# Patient Record
Sex: Male | Born: 1960 | Race: White | Hispanic: No | Smoking: Never smoker
Health system: Southern US, Community
[De-identification: ages and names within clinical notes are randomized; demographics above are authoritative.]

## PROBLEM LIST (undated history)

## (undated) HISTORY — PX: REPLACEMENT TOTAL HIP W/  RESURFACING IMPLANTS: SUR1222

## (undated) HISTORY — PX: HERNIA REPAIR: SHX51

---

## 2004-01-15 ENCOUNTER — Ambulatory Visit: Payer: Self-pay | Admitting: Unknown Physician Specialty

## 2004-02-16 ENCOUNTER — Ambulatory Visit: Payer: Self-pay | Admitting: Surgery

## 2004-06-28 ENCOUNTER — Ambulatory Visit: Payer: Self-pay | Admitting: Gastroenterology

## 2008-07-14 ENCOUNTER — Ambulatory Visit: Payer: Self-pay | Admitting: Family Medicine

## 2008-10-02 ENCOUNTER — Ambulatory Visit: Payer: Self-pay | Admitting: Family Medicine

## 2010-03-26 ENCOUNTER — Ambulatory Visit: Payer: Self-pay | Admitting: Family Medicine

## 2010-04-28 IMAGING — CR CERVICAL SPINE - COMPLETE 4+ VIEW
1 series · 7 of 7 positions shown · non-contrast
Comparison: none

REASON FOR EXAM: neck pain radiating down arm
COMMENTS:

[Series 1: view not recorded · 0.17mm/px · 7 of 7 slices shown]
[im 1/7]
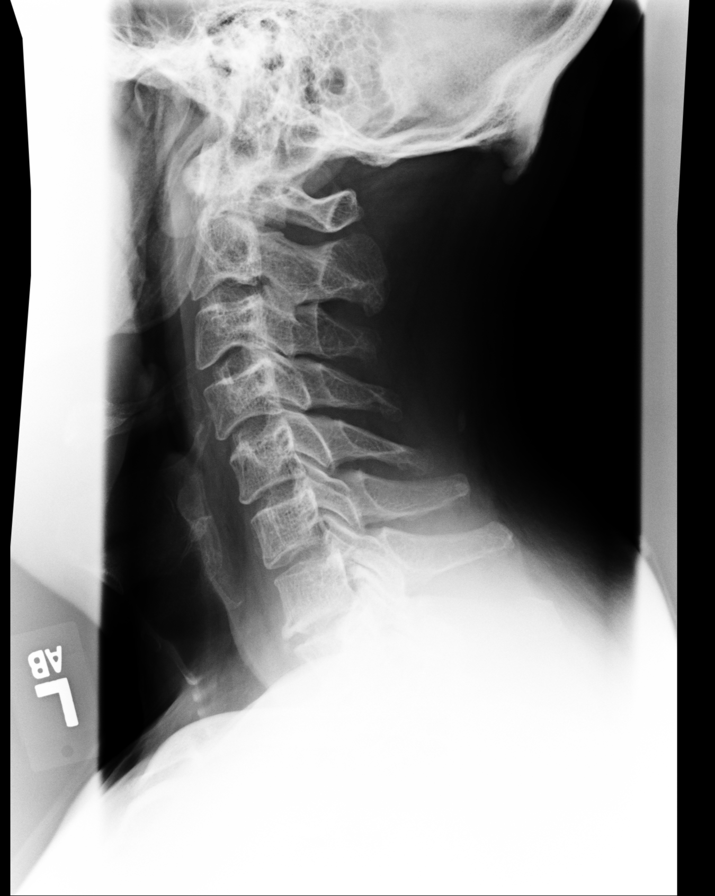
[im 2/7]
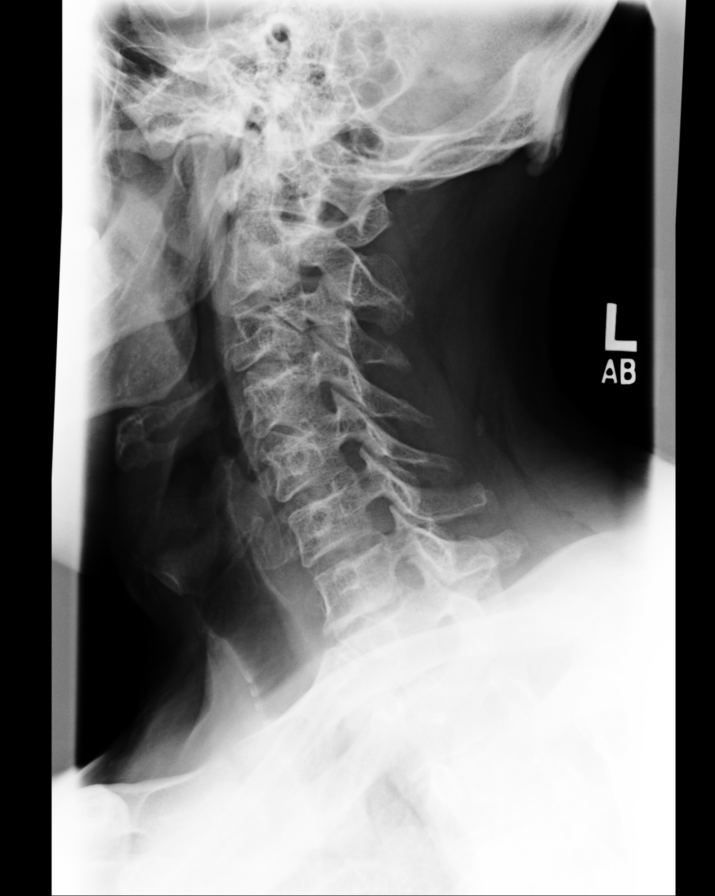
[im 3/7]
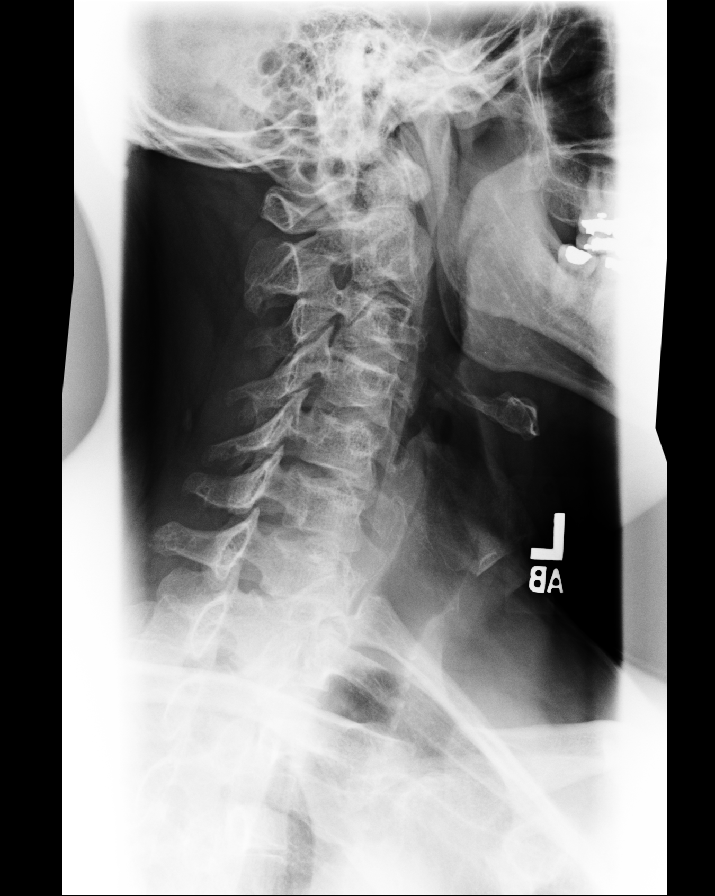
[im 4/7]
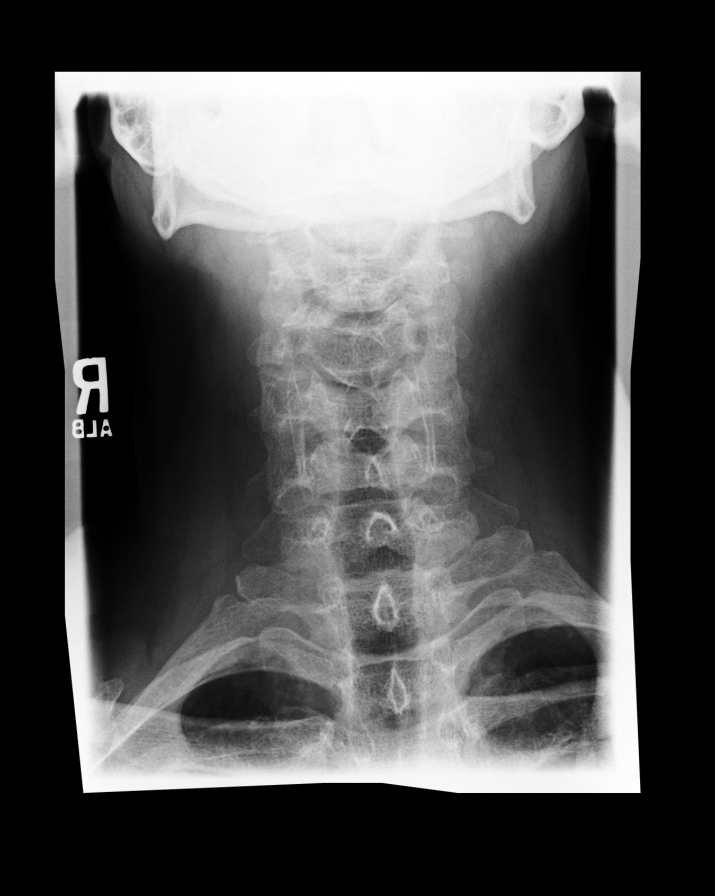
[im 5/7]
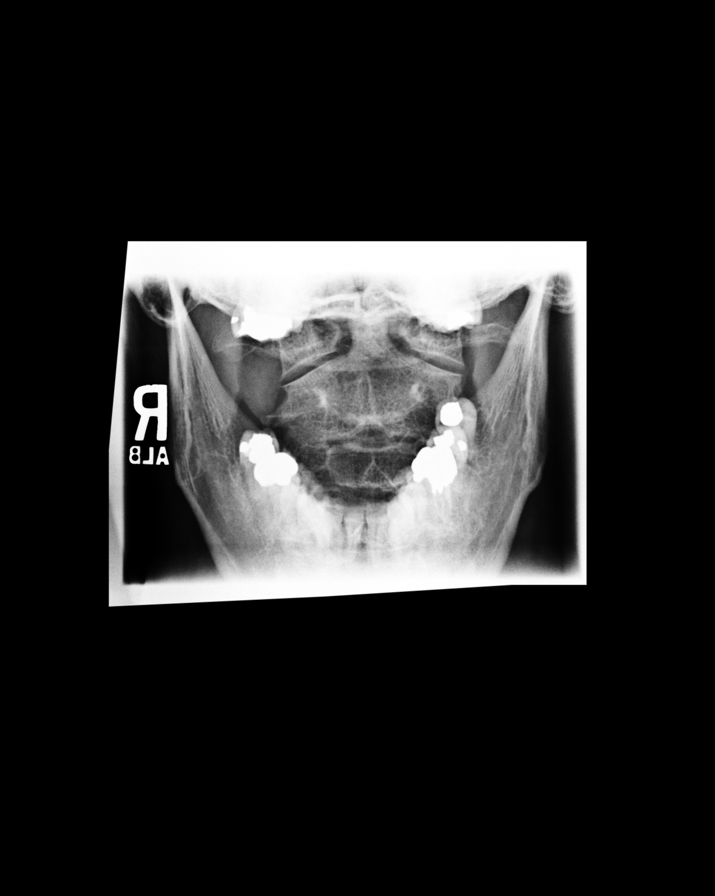
[im 6/7]
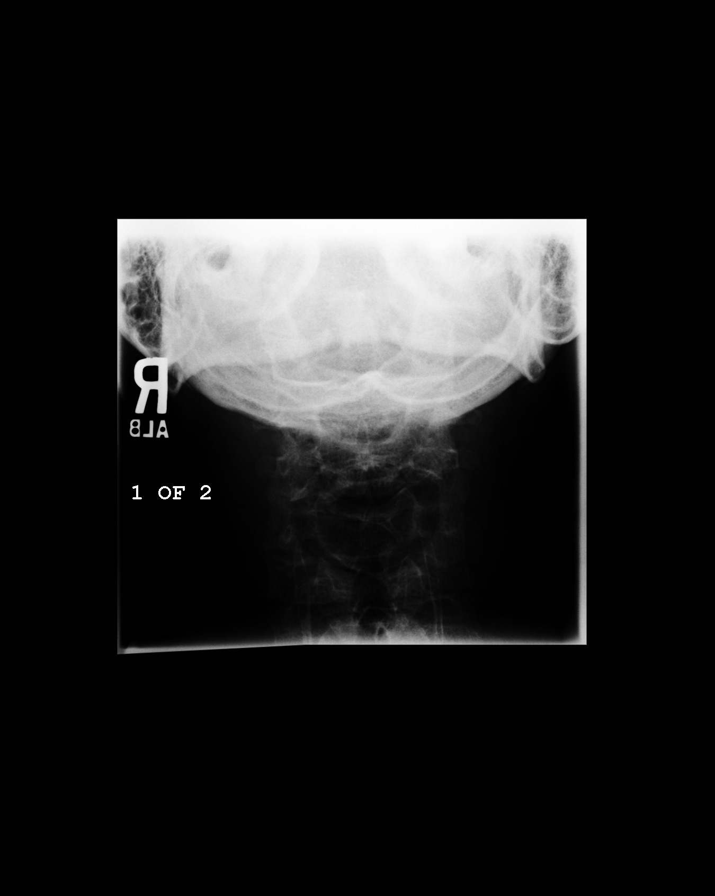
[im 7/7]
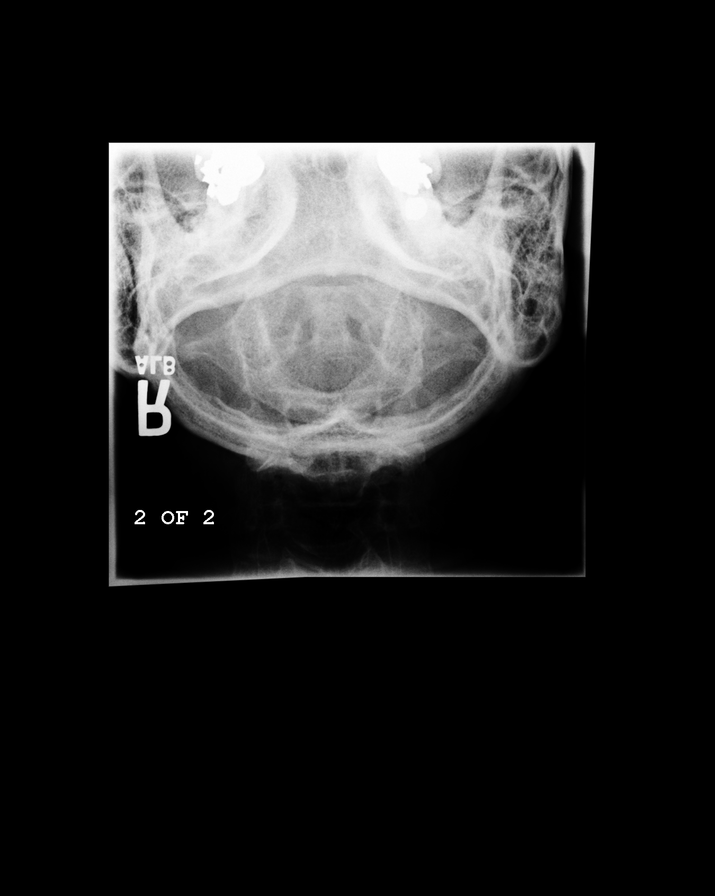

[7 of 7 positions shown; findings below may reference images not displayed]

PROCEDURE:     MDR - MDR CERVICAL SPINE COMPLETE  - October 02, 2008  [DATE]

RESULT:     The cervical vertebral bodies are preserved in height. The
intervertebral disc space heights are well-maintained. The oblique views
reveal mild bony encroachment upon the neural foramina bilaterally in the
upper cervical spine. The odontoid is intact. The lateral masses of C1 align
normally with those of C2.
IMPRESSION: There is mild neural foraminal encroachment in the upper
cervical spine due to chiefly to facet joint osteophyte. Given the patient's
radicular symptoms, MRI may be a useful next step.

## 2011-10-20 IMAGING — CT CT ABDOMEN W/ CM
1 of 2 series · 15 of 32 positions shown, 20 images · IV contrast (isovue)
Comparison: none

REASON FOR EXAM: abdominal pain elevated bilirubin puritis
COMMENTS:

PROCEDURE:     SHKUMBIN - SHKUMBIN ABDOMEN STANDARD W  - March 26, 2010  [DATE]
RESULT:     Comparison: None.
TECHNIQUE: Multiple axial images were obtained of the abdomen, from the lung
bases to the iliac crests, after the administration of oral contrast and 100
mL Isovue 370 intravenous contrast.

[Series 2: soft tissue · axial · 0.81mm/px · z∈[-45,+230]mm · 15 of 63 slices shown, 20 images]
[im 4/63  soft-tissue]
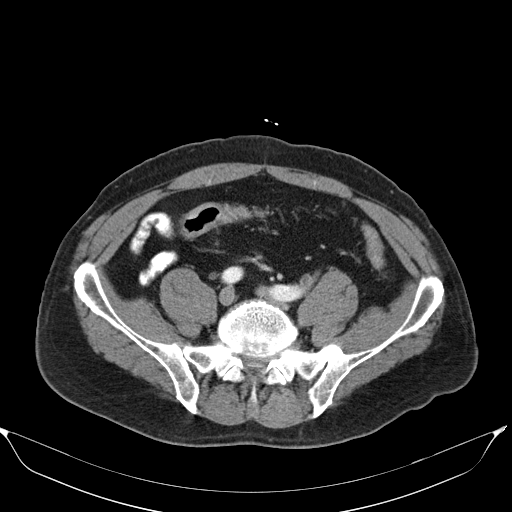
[im 4/63  bone]
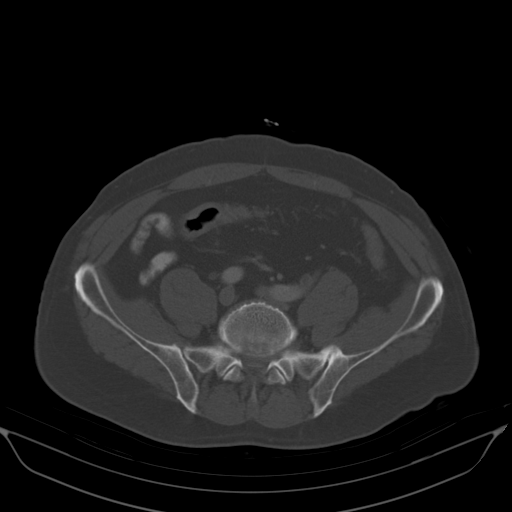
[im 10/63  soft-tissue]
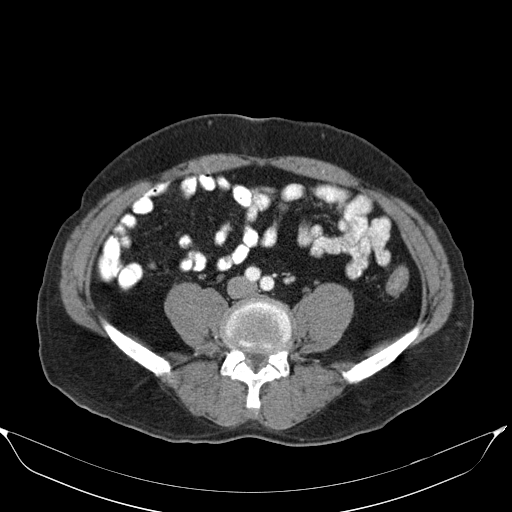
[im 13/63  soft-tissue]
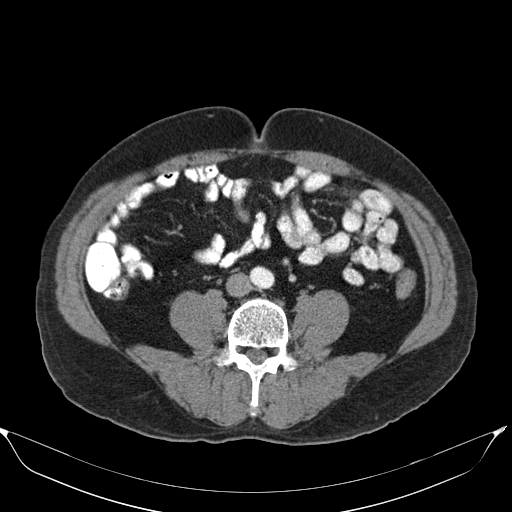
[im 16/63  soft-tissue]
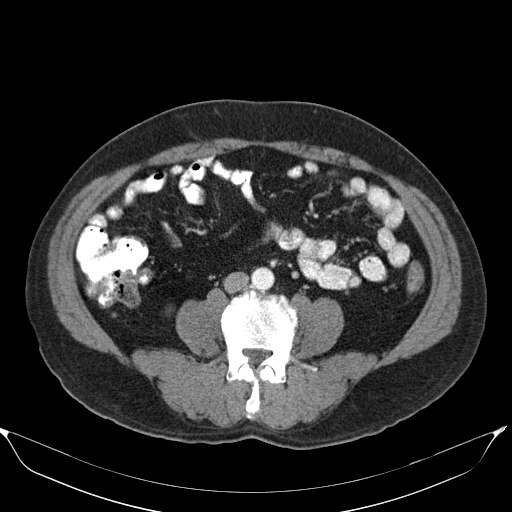
[im 22/63  soft-tissue]
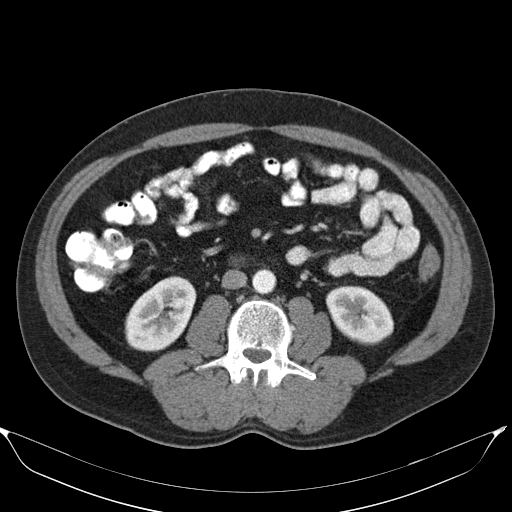
[im 25/63  soft-tissue]
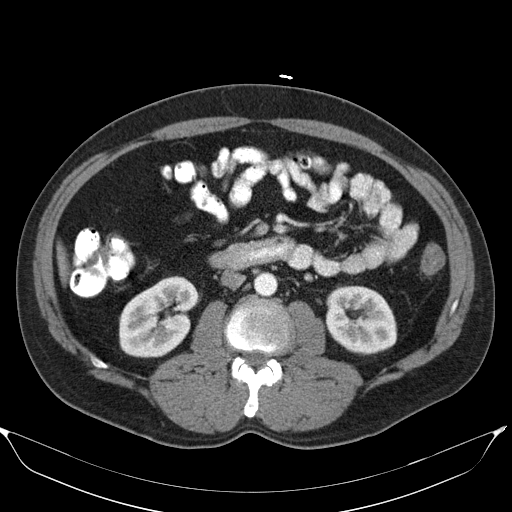
[im 28/63  soft-tissue]
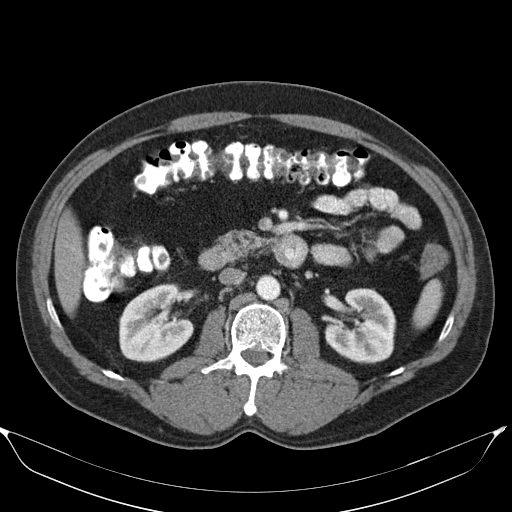
[im 35/63  soft-tissue]
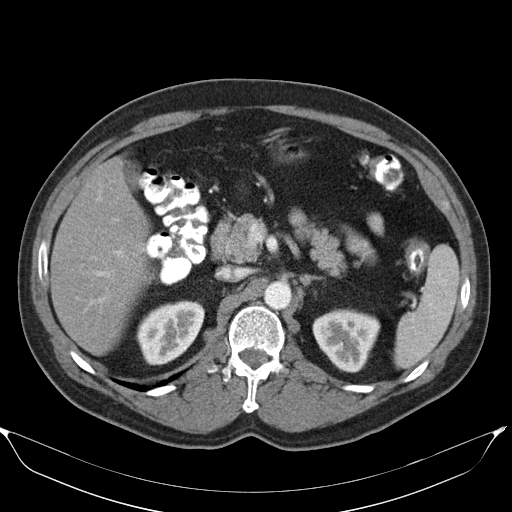
[im 38/63  soft-tissue]
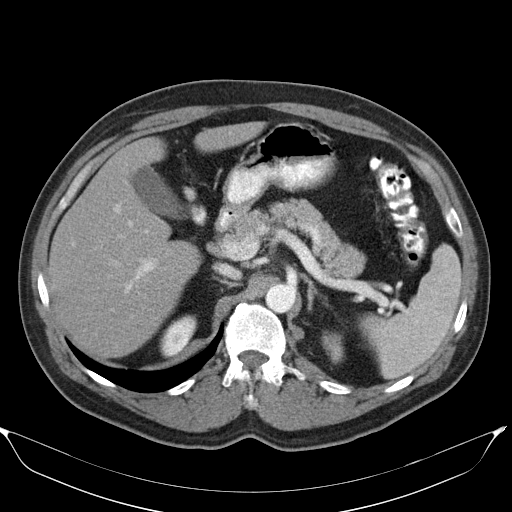
[im 38/63  bone]
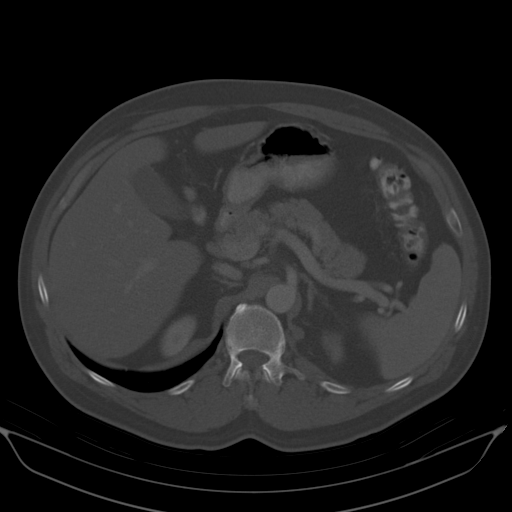
[im 41/63  soft-tissue]
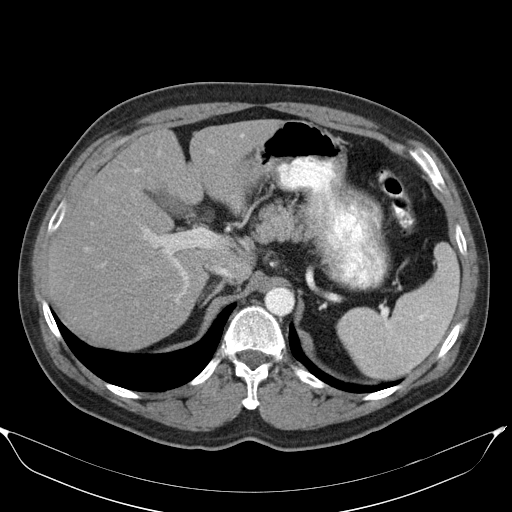
[im 47/63  soft-tissue]
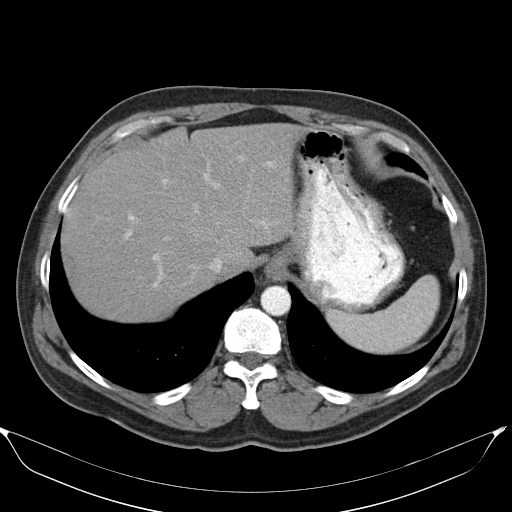
[im 50/63  soft-tissue]
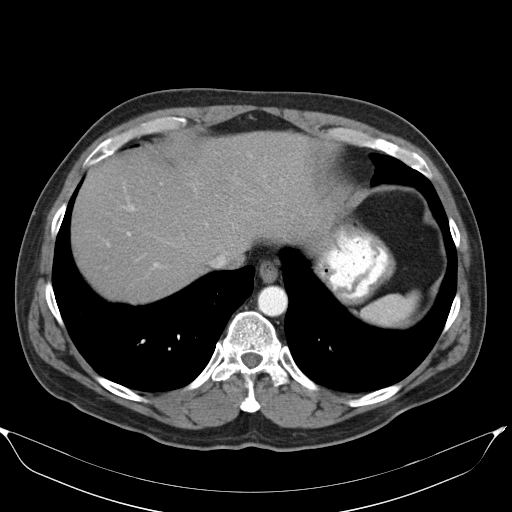
[im 50/63  lung]
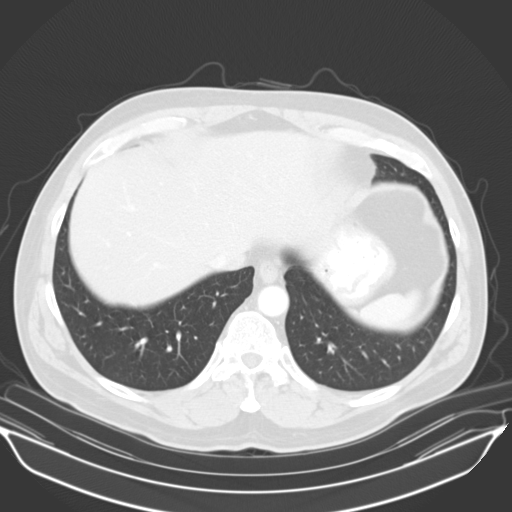
[im 53/63  soft-tissue]
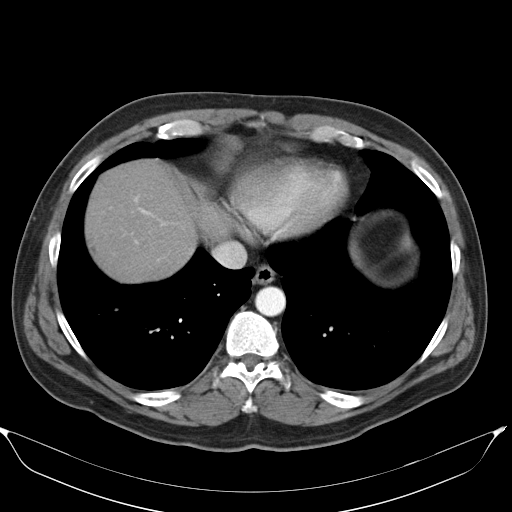
[im 53/63  lung]
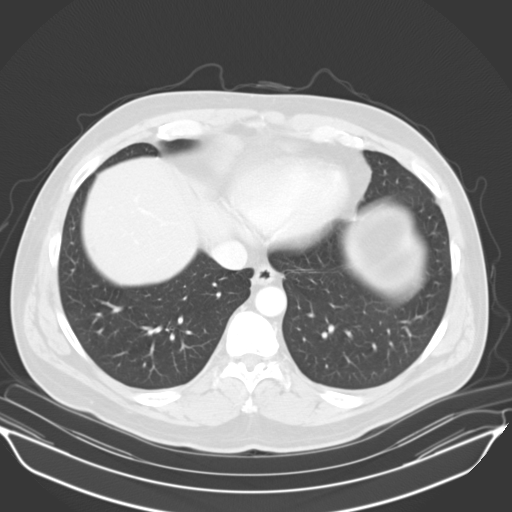
[im 56/63  lung]
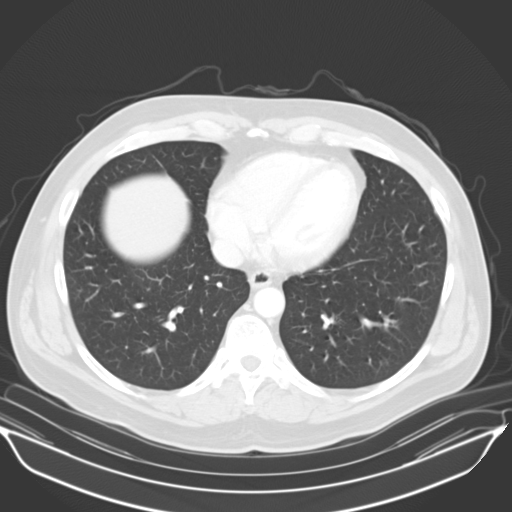
[im 59/63  soft-tissue]
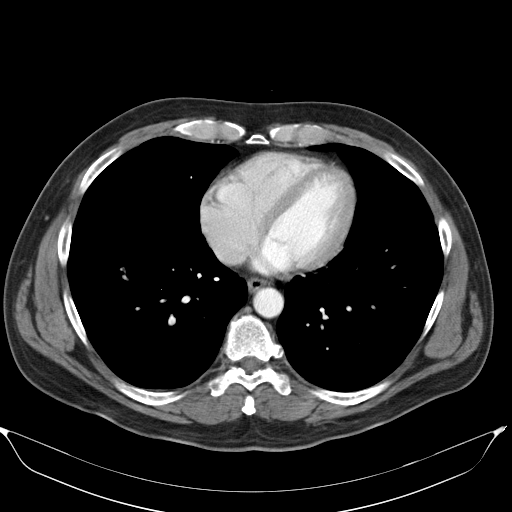
[im 59/63  lung]
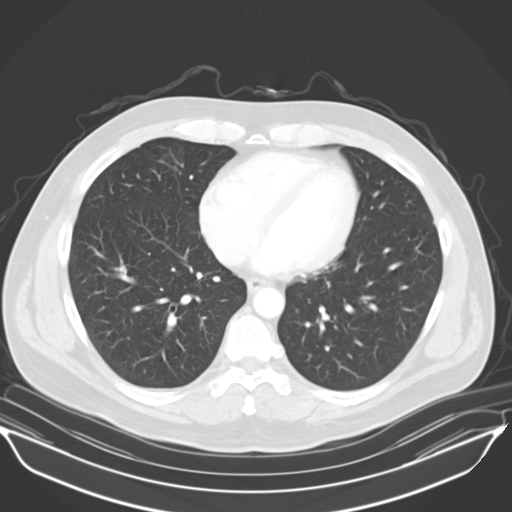

[15 of 32 positions shown; findings below may reference images not displayed]

FINDINGS: No focal liver lesion identified. No intrahepatic or extrahepatic biliary
ductal dilatation. The gallbladder is unremarkable.

The spleen, adrenals, and pancreas are unremarkable. The kidneys enhance
normally. The visualized small and large bowel are normal in caliber. The
appendix is normal.

No aggressive lytic or sclerotic osseous lesions identified.
IMPRESSION: Unremarkable abdominal CT.

## 2015-11-06 ENCOUNTER — Other Ambulatory Visit: Payer: Self-pay

## 2016-11-11 ENCOUNTER — Other Ambulatory Visit: Payer: Self-pay

## 2019-09-23 ENCOUNTER — Other Ambulatory Visit: Payer: Self-pay

## 2019-09-23 ENCOUNTER — Encounter: Payer: Self-pay | Admitting: Emergency Medicine

## 2019-09-23 ENCOUNTER — Ambulatory Visit
Admission: EM | Admit: 2019-09-23 | Discharge: 2019-09-23 | Disposition: A | Discharge status: Home / Self Care | Payer: Managed Care, Other (non HMO) | Attending: Family Medicine | Admitting: Family Medicine

## 2019-09-23 DIAGNOSIS — L03114 Cellulitis of left upper limb: Secondary | ICD-10-CM

## 2019-09-23 MED ORDER — SULFAMETHOXAZOLE-TRIMETHOPRIM 800-160 MG PO TABS: 1.0000 | ORAL_TABLET | Freq: Two times a day (BID) | ORAL | 0 refills | Status: DC

## 2019-09-23 NOTE — Discharge Instructions (Signed)
Warm compresses to affected area °

## 2019-09-23 NOTE — ED Triage Notes (Signed)
Patient states that he got stung in his left lower arm 2 days ago.  Patient reports redness, tenderness, and swelling around the site that started yesterday. Patient states that he has been taking Benadryl.

## 2019-09-23 NOTE — ED Provider Notes (Addendum)
[facility]    CSN: [id number] Arrival date & time: 09/23/19  1020      History   Chief Complaint Chief Complaint  Patient presents with  . Insect Bite    HPI Wayne Owens is a 59 y.o. male.   59 yo male with a c/o a bee or wasp sting to his left forearm 2 days ago. States that initially he had an allergic reaction that same day with redness all over his body, took some benadryl and resolved. However patient since yesterday has noticed localized redness and swelling on the left forearm skin around the stung site. Denies any fevers, chills, shortness of breath.  States he's still taking benadryl.      History reviewed. No pertinent past medical history.  There are no problems to display for this patient.   Past Surgical History:  Procedure Laterality Date  . HERNIA REPAIR    . 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 Authorizing Provider  Multiple Vitamin (MULTIVITAMIN) capsule Take 1 capsule by mouth daily.    [provider]  sulfamethoxazole-trimethoprim (BACTRIM DS) 800-160 MG tablet Take 1 tablet by mouth 2 (two) times daily. 09/23/19   Payton Mccallum, MD    Family History History reviewed. No pertinent family history.  Social History Social History   Tobacco Use  . Smoking status: Never Smoker  . Smokeless tobacco: Never Used  Vaping Use  . Vaping Use: Never used  Substance Use Topics  . Alcohol use: Yes  . Drug use: Never     Allergies   Patient has no known allergies.   Review of Systems Review of Systems   Physical Exam Triage Vital Signs ED Triage Vitals  Enc Vitals Group     BP 09/23/19 1036 120/81     Pulse Rate 09/23/19 1036 69     Resp 09/23/19 1036 16     Temp 09/23/19 1036 98.1 F (36.7 C)     Temp Source 09/23/19 1036 Oral     SpO2 09/23/19 1036 99 %     Weight 09/23/19 1033 165 lb  (74.8 kg)     Height 09/23/19 1033 6' (1.829 m)     Head Circumference --      Peak Flow --      Pain Score 09/23/19 1032 0     Pain Loc --      Pain Edu? --      Excl. in GC? --    No data found.  Updated Vital Signs BP 120/81 (BP Location: Right Arm)   Pulse 69   Temp 98.1 F (36.7 C) (Oral)   Resp 16   Ht 6' (1.829 m)   Wt 74.8 kg   SpO2 99%   BMI 22.38 kg/m   Visual Acuity Right Eye Distance:   Left Eye Distance:   Bilateral Distance:    Right Eye Near:   Left Eye Near:    Bilateral Near:     Physical Exam Vitals and nursing note reviewed.  Constitutional:      General: He is not in acute distress.    Appearance: He is not toxic-appearing.  Musculoskeletal:     Left forearm: Swelling (and erythema) and tenderness (mild) present. No lacerations or bony tenderness.     Comments: Area approx 6x8cm blanchable erythema and warmth with mild tenderness  on the left forearm skin; extremity neurovascularly intact  Neurological:     Mental Status: He is alert.      UC Treatments / Results  Labs (all labs ordered are listed, but only abnormal results are displayed) Labs Reviewed - No data to display  EKG   Radiology No results found.  Procedures Procedures (including critical care time)  Medications Ordered in UC Medications - No data to display  Initial Impression / Assessment and Plan / UC Course  I have reviewed the triage vital signs and the nursing notes.  Pertinent labs & imaging results that were available during my care of the patient were reviewed by me and considered in my medical decision making (see chart for details).     Final Clinical Impressions(s) / UC Diagnoses   Final diagnoses:  Cellulitis of left upper extremity     Discharge Instructions     Warm compresses to affected area    ED Prescriptions    Medication Sig Dispense Auth. Provider   sulfamethoxazole-trimethoprim (BACTRIM DS) 800-160 MG tablet Take 1 tablet by mouth  2 (two) times daily. 20 tablet Payton Mccallum, MD      1. diagnosis reviewed with patient 2. rx as per orders above; reviewed possible side effects, interactions, risks and benefits  3. Recommend supportive treatment as above 4. Follow-up prn if symptoms worsen or don't improve   PDMP not reviewed this encounter.   Payton Mccallum, MD 09/23/19 1151    Payton Mccallum, MD 09/23/19 1153

## 2019-10-21 ENCOUNTER — Other Ambulatory Visit: Payer: Self-pay

## 2019-10-21 ENCOUNTER — Encounter: Payer: Self-pay | Admitting: Emergency Medicine

## 2019-10-21 ENCOUNTER — Ambulatory Visit
Admission: EM | Admit: 2019-10-21 | Discharge: 2019-10-21 | Disposition: A | Discharge status: Home / Self Care | Payer: Managed Care, Other (non HMO) | Attending: Family Medicine | Admitting: Family Medicine

## 2019-10-21 DIAGNOSIS — U071 COVID-19: Secondary | ICD-10-CM | POA: Diagnosis not present

## 2019-10-21 LAB — SARS CORONAVIRUS 2 (TAT 6-24 HRS): SARS Coronavirus 2: POSITIVE — AB

## 2019-10-21 NOTE — ED Triage Notes (Signed)
Patient c/o cough, low grade fever, and stuffy nose that started last Friday.

## 2019-10-21 NOTE — ED Provider Notes (Signed)
[facility]    CSN: [id number] Arrival date & time: 10/21/19  0902      History   Chief Complaint Chief Complaint  Patient presents with  . Cough    HPI Wayne Owens is a 59 y.o. male. presents wanting a covid test done. He has had onset  onset of fever of 99.9, HA and body aches and fatigue 1 week ago. The highest fever was 101.5 3 days ago. Has a non productive cough and on occasion has white to clear color mucous. Had body aches initially, but now is gone. His wife had it 1st who had exposed to 2 other people with covid who had been vaccinated. Has lost his taste and smell 2 days ago. His appetite is fine. Has had mild nausea, but no diarrhea. Has had mild night sweats. He has not had Covid vaccine His siter brought him a pulse ox and has been running in the high 90's. Denies SOB or CP.  Has been taking Vit D, C, Mag, Se, Zinc.    History reviewed. No pertinent past medical history.  There are no problems to display for this patient.   Past Surgical History:  Procedure Laterality Date  . HERNIA REPAIR    . 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 Authorizing Provider  Multiple Vitamin (MULTIVITAMIN) capsule Take 1 capsule by mouth daily.   Yes [provider]    Family History History reviewed. No pertinent family history.  Social History Social History   Tobacco Use  . Smoking status: Never Smoker  . Smokeless tobacco: Never Used  Vaping Use  . Vaping Use: Never used  Substance Use Topics  . Alcohol use: Yes  . Drug use: Never     Allergies   Patient has no known allergies.   Review of Systems Review of Systems  Constitutional: Positive for chills, diaphoresis, fatigue and fever. Negative for activity change and appetite change.  HENT: Positive for postnasal drip. Negative for congestion, rhinorrhea and sore  throat.   Eyes: Negative for discharge.  Respiratory: Positive for cough. Negative for chest tightness and shortness of breath.   Cardiovascular: Negative for chest pain and leg swelling.  Gastrointestinal: Positive for nausea. Negative for abdominal pain, diarrhea and vomiting.  Genitourinary: Negative for difficulty urinating.  Musculoskeletal: Negative for gait problem and myalgias.  Skin: Negative for rash.  Neurological: Negative for headaches.  Hematological: Negative for adenopathy.     Physical Exam Triage Vital Signs ED Triage Vitals  Enc Vitals Group     BP 10/21/19 0920 117/85     Pulse Rate 10/21/19 0920 83     Resp 10/21/19 0920 16     Temp 10/21/19 0920 99.2 F (37.3 C)     Temp Source 10/21/19 0920 Oral     SpO2 10/21/19 0920 98 %     Weight 10/21/19 0917 165 lb (74.8 kg)     Height 10/21/19 0917 6' (1.829 m)     Head Circumference --      Peak Flow --      Pain Score 10/21/19 0917 0     Pain Loc --      Pain Edu? --      Excl. in GC? --    No data found.  Updated Vital Signs BP 117/85 (BP Location: Right Arm)  Pulse 83   Temp 99.2 F (37.3 C) (Oral)   Resp 16   Ht 6' (1.829 m)   Wt 165 lb (74.8 kg)   SpO2 98%   BMI 22.38 kg/m   Visual Acuity Right Eye Distance:   Left Eye Distance:   Bilateral Distance:    Right Eye Near:   Left Eye Near:    Bilateral Near:      Physical Exam Vitals signs and nursing note reviewed.  Constitutional:      General: She is not in acute distress.    Appearance: Normal appearance. She is not ill-appearing, toxic-appearing or diaphoretic.  HENT:     Head: Normocephalic.     Right Ear: Tympanic membrane, ear canal and external ear normal.     Left Ear: Tympanic membrane, ear canal and external ear normal.     Nose: Nose normal.     Mouth/Throat:     Mouth: Mucous membranes are moist.  Eyes:     General: No scleral icterus.       Right eye: No discharge.        Left eye: No discharge.      Conjunctiva/sclera: Conjunctivae normal.  Neck:     Musculoskeletal: Neck supple. No neck rigidity.  Cardiovascular:     Rate and Rhythm: Normal rate and regular rhythm.     Heart sounds: No murmur.  Pulmonary:     Effort: Pulmonary effort is normal.     Breath sounds: Normal breath sounds. No egophony.  Musculoskeletal: Normal range of motion.  Lymphadenopathy:     Cervical: No cervical adenopathy.  Skin:    General: Skin is warm and dry.     Coloration: Skin is not jaundiced.     Findings: No rash.  Neurological:     Mental Status: She is alert and oriented to person, place, and time.     Gait: Gait normal.  Psychiatric:        Mood and Affect: Mood normal.        Behavior: Behavior normal.        Thought Content: Thought content normal.        Judgment: Judgment normal.    UC Treatments / Results  Labs (all labs ordered are listed, but only abnormal results are displayed) Labs Reviewed  SARS CORONAVIRUS 2 (TAT 6-24 HRS)    EKG   Radiology No results found.  Procedures Procedures (including critical care time)  Medications Ordered in UC Medications - No data to display  Initial Impression / Assessment and Plan / UC Course  I have reviewed the triage vital signs and the nursing notes. I believe he has Covid and its on its way to resolving.  Advised to check his Mychart for his results and continue taking the supplements to support his immune system and monitor his pulse ox as he has been. See instructions   Final Clinical Impressions(s) / UC Diagnoses   Final diagnoses:  COVID-19 determined by clinical diagnostic criteria     Discharge Instructions     Continue with the supplements you are taking until you are well. For the Winter time stay on vit D 2,000 IU per day and make sure to take it with fatty meal since it helps it absorb. Start baby aspirin 81 mg every day for the next 3 week to prevent blood clots which can happen after 10 days of having Covid.   If you oxygen drops to low 90's or below 90, you need  to go to ER, you may need to be admitted and placed on oxygen.  You covid result will be posted by the lab as soon as it is done, so you can check on Mychart for the results.     ED Prescriptions    None     PDMP not reviewed this encounter.   Garey Ham, PA-C 10/21/19 1004

## 2019-10-21 NOTE — Discharge Instructions (Signed)
Continue with the supplements you are taking until you are well. For the Winter time stay on vit D 2,000 IU per day and make sure to take it with fatty meal since it helps it absorb. Start baby aspirin 81 mg every day for the next 3 week to prevent blood clots which can happen after 10 days of having Covid.  If you oxygen drops to low 90's or below 90, you need to go to ER, you may need to be admitted and placed on oxygen.  You covid result will be posted by the lab as soon as it is done, so you can check on Mychart for the results.

## 2020-04-06 ENCOUNTER — Other Ambulatory Visit: Payer: Self-pay | Admitting: Orthopedic Surgery

## 2022-03-26 ENCOUNTER — Other Ambulatory Visit: Payer: Self-pay

## 2022-03-26 DIAGNOSIS — H90A22 Sensorineural hearing loss, unilateral, left ear, with restricted hearing on the contralateral side: Secondary | ICD-10-CM

## 2022-03-27 ENCOUNTER — Other Ambulatory Visit: Payer: Self-pay | Admitting: Student

## 2022-03-27 DIAGNOSIS — H90A22 Sensorineural hearing loss, unilateral, left ear, with restricted hearing on the contralateral side: Secondary | ICD-10-CM
